# Patient Record
Sex: Female | Born: 1990 | Race: Black or African American | Hispanic: No | Marital: Single | Smoking: Never smoker
Health system: Southern US, Community
[De-identification: ages and names within clinical notes are randomized; demographics above are authoritative.]

## PROBLEM LIST (undated history)

## (undated) DIAGNOSIS — I82409 Acute embolism and thrombosis of unspecified deep veins of unspecified lower extremity: Secondary | ICD-10-CM

## (undated) DIAGNOSIS — E669 Obesity, unspecified: Secondary | ICD-10-CM

## (undated) HISTORY — PX: ANTERIOR CRUCIATE LIGAMENT REPAIR: SHX115

## (undated) HISTORY — DX: Obesity, unspecified: E66.9

## (undated) HISTORY — DX: Acute embolism and thrombosis of unspecified deep veins of unspecified lower extremity: I82.409

---
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
## 2019-11-04 LAB — OB RESULTS CONSOLE RPR: RPR: NONREACTIVE

## 2019-11-04 LAB — OB RESULTS CONSOLE GC/CHLAMYDIA
Chlamydia: NEGATIVE
Gonorrhea: NEGATIVE

## 2019-11-04 LAB — OB RESULTS CONSOLE HIV ANTIBODY (ROUTINE TESTING): HIV: NONREACTIVE

## 2019-11-04 LAB — OB RESULTS CONSOLE RUBELLA ANTIBODY, IGM: Rubella: IMMUNE

## 2020-04-26 ENCOUNTER — Other Ambulatory Visit: Payer: Self-pay | Admitting: Obstetrics and Gynecology

## 2020-04-26 ENCOUNTER — Ambulatory Visit: Payer: 59 | Admitting: *Deleted

## 2020-04-26 ENCOUNTER — Other Ambulatory Visit: Payer: Self-pay

## 2020-04-26 ENCOUNTER — Encounter: Payer: Self-pay | Admitting: *Deleted

## 2020-04-26 ENCOUNTER — Ambulatory Visit: Payer: 59 | Attending: Obstetrics and Gynecology

## 2020-04-26 DIAGNOSIS — Z363 Encounter for antenatal screening for malformations: Secondary | ICD-10-CM

## 2020-04-27 ENCOUNTER — Other Ambulatory Visit: Payer: Self-pay | Admitting: *Deleted

## 2020-04-27 DIAGNOSIS — O365930 Maternal care for other known or suspected poor fetal growth, third trimester, not applicable or unspecified: Secondary | ICD-10-CM

## 2020-05-03 ENCOUNTER — Encounter: Payer: Self-pay | Admitting: *Deleted

## 2020-05-03 ENCOUNTER — Ambulatory Visit: Payer: 59 | Attending: Obstetrics and Gynecology

## 2020-05-03 ENCOUNTER — Other Ambulatory Visit: Payer: Self-pay | Admitting: *Deleted

## 2020-05-03 ENCOUNTER — Other Ambulatory Visit: Payer: Self-pay

## 2020-05-03 ENCOUNTER — Ambulatory Visit: Payer: 59 | Admitting: *Deleted

## 2020-05-03 ENCOUNTER — Other Ambulatory Visit: Payer: Self-pay | Admitting: Obstetrics and Gynecology

## 2020-05-03 VITALS — BP 136/77 | HR 93

## 2020-05-03 DIAGNOSIS — O365930 Maternal care for other known or suspected poor fetal growth, third trimester, not applicable or unspecified: Secondary | ICD-10-CM

## 2020-05-10 ENCOUNTER — Ambulatory Visit: Payer: 59 | Attending: Obstetrics and Gynecology

## 2020-05-10 ENCOUNTER — Ambulatory Visit: Payer: 59 | Admitting: *Deleted

## 2020-05-10 ENCOUNTER — Encounter: Payer: Self-pay | Admitting: *Deleted

## 2020-05-10 ENCOUNTER — Other Ambulatory Visit: Payer: Self-pay

## 2020-05-10 DIAGNOSIS — O365930 Maternal care for other known or suspected poor fetal growth, third trimester, not applicable or unspecified: Secondary | ICD-10-CM | POA: Diagnosis not present

## 2020-05-11 ENCOUNTER — Other Ambulatory Visit: Payer: Self-pay | Admitting: *Deleted

## 2020-05-11 DIAGNOSIS — O365930 Maternal care for other known or suspected poor fetal growth, third trimester, not applicable or unspecified: Secondary | ICD-10-CM

## 2020-05-13 LAB — OB RESULTS CONSOLE GBS: GBS: NEGATIVE

## 2020-05-17 ENCOUNTER — Ambulatory Visit: Payer: 59 | Admitting: *Deleted

## 2020-05-17 ENCOUNTER — Encounter: Payer: Self-pay | Admitting: *Deleted

## 2020-05-17 ENCOUNTER — Other Ambulatory Visit: Payer: Self-pay

## 2020-05-17 ENCOUNTER — Other Ambulatory Visit (HOSPITAL_COMMUNITY): Payer: Self-pay

## 2020-05-17 ENCOUNTER — Ambulatory Visit: Payer: 59 | Attending: Obstetrics and Gynecology

## 2020-05-17 VITALS — BP 127/82 | HR 92

## 2020-05-17 DIAGNOSIS — O365930 Maternal care for other known or suspected poor fetal growth, third trimester, not applicable or unspecified: Secondary | ICD-10-CM | POA: Insufficient documentation

## 2020-05-17 DIAGNOSIS — Z86718 Personal history of other venous thrombosis and embolism: Secondary | ICD-10-CM

## 2020-05-17 DIAGNOSIS — O99213 Obesity complicating pregnancy, third trimester: Secondary | ICD-10-CM

## 2020-05-17 DIAGNOSIS — O10013 Pre-existing essential hypertension complicating pregnancy, third trimester: Secondary | ICD-10-CM

## 2020-05-17 DIAGNOSIS — E669 Obesity, unspecified: Secondary | ICD-10-CM

## 2020-05-17 DIAGNOSIS — O3113X0 Continuing pregnancy after spontaneous abortion of one fetus or more, third trimester, not applicable or unspecified: Secondary | ICD-10-CM

## 2020-05-17 DIAGNOSIS — Z3A36 36 weeks gestation of pregnancy: Secondary | ICD-10-CM

## 2020-05-17 MED ORDER — HEPARIN SODIUM (PORCINE) 5000 UNIT/ML IJ SOLN
5000.0000 [IU] | Freq: Two times a day (BID) | INTRAMUSCULAR | 0 refills | Status: DC
  Filled 2020-05-17: qty 42, 21d supply, fill #0

## 2020-05-17 MED ORDER — HEPARIN SODIUM (PORCINE) 5000 UNIT/0.5ML IJ SOSY
PREFILLED_SYRINGE | INTRAMUSCULAR | 0 refills | Status: DC
  Filled 2020-05-17: qty 14, 14d supply, fill #0

## 2020-05-17 MED ORDER — "INSULIN SYRINGE-NEEDLE U-100 30G X 5/16"" 1 ML MISC"
Freq: Two times a day (BID) | 0 refills | Status: DC
  Filled 2020-05-17: qty 40, 20d supply, fill #0

## 2020-05-20 ENCOUNTER — Other Ambulatory Visit: Payer: Self-pay | Admitting: Obstetrics and Gynecology

## 2020-05-20 DIAGNOSIS — O365930 Maternal care for other known or suspected poor fetal growth, third trimester, not applicable or unspecified: Secondary | ICD-10-CM

## 2020-05-24 ENCOUNTER — Other Ambulatory Visit: Payer: Self-pay | Admitting: Obstetrics

## 2020-05-24 ENCOUNTER — Other Ambulatory Visit: Payer: Self-pay

## 2020-05-24 ENCOUNTER — Ambulatory Visit: Payer: 59 | Attending: Obstetrics

## 2020-05-24 ENCOUNTER — Encounter: Payer: Self-pay | Admitting: *Deleted

## 2020-05-24 ENCOUNTER — Ambulatory Visit: Payer: 59 | Admitting: *Deleted

## 2020-05-24 VITALS — BP 125/77 | HR 95

## 2020-05-24 DIAGNOSIS — O365930 Maternal care for other known or suspected poor fetal growth, third trimester, not applicable or unspecified: Secondary | ICD-10-CM

## 2020-05-25 ENCOUNTER — Telehealth (HOSPITAL_COMMUNITY): Payer: Self-pay | Admitting: *Deleted

## 2020-05-25 ENCOUNTER — Encounter (HOSPITAL_COMMUNITY): Payer: Self-pay | Admitting: *Deleted

## 2020-05-25 ENCOUNTER — Other Ambulatory Visit: Payer: Self-pay | Admitting: *Deleted

## 2020-05-25 DIAGNOSIS — O365930 Maternal care for other known or suspected poor fetal growth, third trimester, not applicable or unspecified: Secondary | ICD-10-CM

## 2020-05-25 NOTE — Telephone Encounter (Signed)
Preadmission screen  

## 2020-06-02 ENCOUNTER — Ambulatory Visit (HOSPITAL_BASED_OUTPATIENT_CLINIC_OR_DEPARTMENT_OTHER): Payer: 59

## 2020-06-02 ENCOUNTER — Other Ambulatory Visit: Payer: Self-pay

## 2020-06-02 ENCOUNTER — Ambulatory Visit: Payer: 59 | Admitting: *Deleted

## 2020-06-02 ENCOUNTER — Encounter: Payer: Self-pay | Admitting: *Deleted

## 2020-06-02 ENCOUNTER — Other Ambulatory Visit (HOSPITAL_COMMUNITY)
Admission: RE | Admit: 2020-06-02 | Discharge: 2020-06-02 | Disposition: A | Discharge status: Home / Self Care | Payer: 59 | Source: Ambulatory Visit | Attending: Obstetrics and Gynecology | Admitting: Obstetrics and Gynecology

## 2020-06-02 VITALS — BP 135/81 | HR 90

## 2020-06-02 DIAGNOSIS — O10913 Unspecified pre-existing hypertension complicating pregnancy, third trimester: Secondary | ICD-10-CM | POA: Insufficient documentation

## 2020-06-02 DIAGNOSIS — O365930 Maternal care for other known or suspected poor fetal growth, third trimester, not applicable or unspecified: Secondary | ICD-10-CM

## 2020-06-02 DIAGNOSIS — Z3A38 38 weeks gestation of pregnancy: Secondary | ICD-10-CM | POA: Insufficient documentation

## 2020-06-02 DIAGNOSIS — Z20822 Contact with and (suspected) exposure to covid-19: Secondary | ICD-10-CM | POA: Insufficient documentation

## 2020-06-02 DIAGNOSIS — O99213 Obesity complicating pregnancy, third trimester: Secondary | ICD-10-CM | POA: Insufficient documentation

## 2020-06-02 LAB — SARS CORONAVIRUS 2 (TAT 6-24 HRS): SARS Coronavirus 2: NEGATIVE

## 2020-06-03 ENCOUNTER — Inpatient Hospital Stay (HOSPITAL_COMMUNITY): Admission: AD | Admit: 2020-06-03 | Payer: 59 | Source: Home / Self Care

## 2020-06-03 ENCOUNTER — Other Ambulatory Visit: Payer: Self-pay | Admitting: Obstetrics and Gynecology

## 2020-06-03 NOTE — H&P (Signed)
Amy Morgan is a 30 y.o. female G2P0010 at 38 weeks and 4 days presenting for induction of labor due to IUGR> pt has been followed by MFM ( induction recommended b/w 38 and 39 weeks )  Pregnancy also complicated by h/o DVT / Gestational hypertension/ morbid obesity. Prenatal care provided by Dr. Gerald Leitz with [facility].   U/S 4/4 EFW 4 lbs and 15 oz ( 5 %ile) normal dopplers . Posterior placenta / cephalic presentation.   Last does of heparin Thursday 4/21 in AM   . OB History    Gravida  2   Para      Term      Preterm      AB  1   Living        SAB      IAB  1   Ectopic      Multiple      Live Births             Past Medical History:  Diagnosis Date  . DVT (deep venous thrombosis) (HCC)   . Obesity    Past Surgical History:  Procedure Laterality Date  . ANTERIOR CRUCIATE LIGAMENT REPAIR     Family History: family history includes Diabetes in her paternal grandmother; Hypertension in her maternal grandmother. Social History:  reports that she has never smoked. She has never used smokeless tobacco. She reports previous alcohol use. She reports that she does not use drugs.     Maternal Diabetes: No Genetic Screening: Normal Maternal Ultrasounds/Referrals: Normal Fetal Ultrasounds or other Referrals:  Other:  Maternal Substance Abuse:  No Significant Maternal Medications:  None Significant Maternal Lab Results:  Group B Strep negative Other Comments:  None  Review of Systems  Constitutional: Negative.   HENT: Negative.   Eyes: Negative.   Respiratory: Negative.   Cardiovascular: Negative.   Gastrointestinal: Negative.   Endocrine: Negative.   Genitourinary: Negative.   Musculoskeletal: Negative.   Skin: Negative.   Allergic/Immunologic: Negative.   Neurological: Negative.   Hematological: Negative.   Psychiatric/Behavioral: Negative.    History   Last menstrual period 09/02/2019. Maternal Exam:  Introitus: Normal vulva.   Physical  Exam Vitals reviewed.  Constitutional:      Appearance: Normal appearance.  HENT:     Head: Normocephalic and atraumatic.     Nose: Nose normal.  Eyes:     Pupils: Pupils are equal, round, and reactive to light.  Cardiovascular:     Rate and Rhythm: Normal rate and regular rhythm.     Pulses: Normal pulses.     Heart sounds: Normal heart sounds.  Pulmonary:     Effort: Pulmonary effort is normal.     Breath sounds: Normal breath sounds.  Abdominal:     Tenderness: There is no abdominal tenderness.  Genitourinary:    General: Normal vulva.  Musculoskeletal:        General: Normal range of motion.     Cervical back: Normal range of motion and neck supple.  Skin:    General: Skin is warm and dry.  Neurological:     General: No focal deficit present.     Mental Status: She is alert and oriented to person, place, and time.  Psychiatric:        Mood and Affect: Mood normal.     Prenatal labs: ABO, Rh:  O positive  Antibody:  Negative  Rubella:   Immune  RPR:   Nonreactive  HBsAg:  Negative   HIV:  Negative  GBS:   negative on 05/13/2020 Assessment/Plan: 38 weeks and 4 days with IUGR/ H/O DVT/ gestational hypertension / Morbid obesity  - Admit to Labor and Delivery for induction  - Cytotec for cervical ripening - gbs negative  -DVT prophylaxis - d/c heparin . Plan SCD's in peripartum and restart lovenox postpartum  - PIH labs on admission  - Anticipate SVD    Gerald Leitz 06/03/2020, 6:16 PM

## 2020-06-03 NOTE — H&P (Deleted)
  The note originally documented on this encounter has been moved the the encounter in which it belongs.  

## 2020-06-04 ENCOUNTER — Encounter (HOSPITAL_COMMUNITY): Payer: Self-pay | Admitting: Obstetrics and Gynecology

## 2020-06-04 ENCOUNTER — Inpatient Hospital Stay (HOSPITAL_COMMUNITY): Payer: 59 | Admitting: Anesthesiology

## 2020-06-04 ENCOUNTER — Encounter (HOSPITAL_COMMUNITY)
Admission: AD | Disposition: A | Discharge status: Home / Self Care | Payer: Self-pay | Source: Home / Self Care | Attending: Obstetrics and Gynecology

## 2020-06-04 ENCOUNTER — Other Ambulatory Visit: Payer: Self-pay

## 2020-06-04 ENCOUNTER — Inpatient Hospital Stay (HOSPITAL_COMMUNITY): Payer: 59

## 2020-06-04 ENCOUNTER — Inpatient Hospital Stay (HOSPITAL_COMMUNITY)
Admission: AD | Admit: 2020-06-04 | Discharge: 2020-06-07 | DRG: 788 | Disposition: A | Discharge status: Home / Self Care | Payer: 59 | Attending: Obstetrics and Gynecology | Admitting: Obstetrics and Gynecology

## 2020-06-04 DIAGNOSIS — O99214 Obesity complicating childbirth: Secondary | ICD-10-CM | POA: Diagnosis present

## 2020-06-04 DIAGNOSIS — K219 Gastro-esophageal reflux disease without esophagitis: Secondary | ICD-10-CM | POA: Diagnosis present

## 2020-06-04 DIAGNOSIS — O134 Gestational [pregnancy-induced] hypertension without significant proteinuria, complicating childbirth: Secondary | ICD-10-CM | POA: Diagnosis present

## 2020-06-04 DIAGNOSIS — O365930 Maternal care for other known or suspected poor fetal growth, third trimester, not applicable or unspecified: Principal | ICD-10-CM | POA: Diagnosis present

## 2020-06-04 DIAGNOSIS — O1002 Pre-existing essential hypertension complicating childbirth: Secondary | ICD-10-CM | POA: Diagnosis present

## 2020-06-04 DIAGNOSIS — O365931 Maternal care for other known or suspected poor fetal growth, third trimester, fetus 1: Secondary | ICD-10-CM | POA: Diagnosis present

## 2020-06-04 DIAGNOSIS — Z86718 Personal history of other venous thrombosis and embolism: Secondary | ICD-10-CM

## 2020-06-04 DIAGNOSIS — O9962 Diseases of the digestive system complicating childbirth: Secondary | ICD-10-CM | POA: Diagnosis present

## 2020-06-04 DIAGNOSIS — Z20822 Contact with and (suspected) exposure to covid-19: Secondary | ICD-10-CM | POA: Diagnosis present

## 2020-06-04 DIAGNOSIS — Z3A38 38 weeks gestation of pregnancy: Secondary | ICD-10-CM | POA: Diagnosis not present

## 2020-06-04 DIAGNOSIS — Z98891 History of uterine scar from previous surgery: Secondary | ICD-10-CM

## 2020-06-04 LAB — COMPREHENSIVE METABOLIC PANEL WITH GFR
ALT: 15 U/L (ref 0–44)
AST: 17 U/L (ref 15–41)
Albumin: 2.7 g/dL — ABNORMAL LOW (ref 3.5–5.0)
Alkaline Phosphatase: 58 U/L (ref 38–126)
Anion gap: 9 (ref 5–15)
BUN: 7 mg/dL (ref 6–20)
CO2: 23 mmol/L (ref 22–32)
Calcium: 9.4 mg/dL (ref 8.9–10.3)
Chloride: 104 mmol/L (ref 98–111)
Creatinine, Ser: 0.66 mg/dL (ref 0.44–1.00)
GFR, Estimated: >60 mL/min (ref >60)
Glucose, Bld: 124 mg/dL — ABNORMAL HIGH (ref 70–99)
Potassium: 3.6 mmol/L (ref 3.5–5.1)
Sodium: 136 mmol/L (ref 135–145)
Total Bilirubin: 0.3 mg/dL (ref 0.3–1.2)
Total Protein: 6 g/dL — ABNORMAL LOW (ref 6.5–8.1)

## 2020-06-04 LAB — CBC
HCT: 31 % — ABNORMAL LOW (ref 36.0–46.0)
Hemoglobin: 10.5 g/dL — ABNORMAL LOW (ref 12.0–15.0)
MCH: 29.7 pg (ref 26.0–34.0)
MCHC: 33.9 g/dL (ref 30.0–36.0)
MCV: 87.8 fL (ref 80.0–100.0)
Platelets: 226 10*3/uL (ref 150–400)
RBC: 3.53 MIL/uL — ABNORMAL LOW (ref 3.87–5.11)
RDW: 13.1 % (ref 11.5–15.5)
WBC: 10.7 10*3/uL — ABNORMAL HIGH (ref 4.0–10.5)
nRBC: 0 % (ref 0.0–0.2)

## 2020-06-04 LAB — PROTEIN / CREATININE RATIO, URINE
Creatinine, Urine: 124.21 mg/dL
Protein Creatinine Ratio: 0.08 mg/mg{creat} (ref 0.00–0.15)
Total Protein, Urine: 10 mg/dL

## 2020-06-04 LAB — RPR: RPR Ser Ql: NONREACTIVE

## 2020-06-04 LAB — TYPE AND SCREEN
ABO/RH(D): O POS
Antibody Screen: NEGATIVE

## 2020-06-04 SURGERY — Surgical Case
Anesthesia: Spinal

## 2020-06-04 MED ORDER — NALBUPHINE HCL 10 MG/ML IJ SOLN: 5.0000 mg | Freq: Once | INTRAMUSCULAR | Status: DC | PRN

## 2020-06-04 MED ORDER — SOD CITRATE-CITRIC ACID 500-334 MG/5ML PO SOLN
30.0000 mL | ORAL | Status: DC | PRN
  Filled 2020-06-04: qty 15

## 2020-06-04 MED ORDER — METOCLOPRAMIDE HCL 5 MG/ML IJ SOLN
INTRAMUSCULAR | Status: DC | PRN
  Administered 2020-06-04: 10 mg via INTRAVENOUS

## 2020-06-04 MED ORDER — ACETAMINOPHEN 325 MG PO TABS: 650.0000 mg | ORAL_TABLET | ORAL | Status: DC | PRN

## 2020-06-04 MED ORDER — LIDOCAINE HCL (PF) 1 % IJ SOLN: 30.0000 mL | INTRAMUSCULAR | Status: DC | PRN

## 2020-06-04 MED ORDER — COCONUT OIL OIL: 1.0000 "application " | TOPICAL_OIL | Status: DC | PRN

## 2020-06-04 MED ORDER — NALOXONE HCL 4 MG/10ML IJ SOLN
1.0000 ug/kg/h | INTRAVENOUS | Status: DC | PRN
  Filled 2020-06-04: qty 5

## 2020-06-04 MED ORDER — ONDANSETRON HCL 4 MG/2ML IJ SOLN: 4.0000 mg | Freq: Four times a day (QID) | INTRAMUSCULAR | Status: DC | PRN

## 2020-06-04 MED ORDER — KETOROLAC TROMETHAMINE 30 MG/ML IJ SOLN
30.0000 mg | Freq: Four times a day (QID) | INTRAMUSCULAR | Status: AC | PRN
  Administered 2020-06-04: 30 mg via INTRAMUSCULAR

## 2020-06-04 MED ORDER — SIMETHICONE 80 MG PO CHEW
80.0000 mg | CHEWABLE_TABLET | ORAL | Status: DC | PRN
  Administered 2020-06-05: 80 mg via ORAL

## 2020-06-04 MED ORDER — MORPHINE SULFATE (PF) 0.5 MG/ML IJ SOLN
INTRAMUSCULAR | Status: AC
  Filled 2020-06-04: qty 10

## 2020-06-04 MED ORDER — OXYTOCIN-SODIUM CHLORIDE 30-0.9 UT/500ML-% IV SOLN
1.0000 m[IU]/min | INTRAVENOUS | Status: DC
  Administered 2020-06-04: 1 m[IU]/min via INTRAVENOUS

## 2020-06-04 MED ORDER — FENTANYL CITRATE (PF) 100 MCG/2ML IJ SOLN: 50.0000 ug | INTRAMUSCULAR | Status: DC | PRN

## 2020-06-04 MED ORDER — PHENYLEPHRINE HCL-NACL 20-0.9 MG/250ML-% IV SOLN
INTRAVENOUS | Status: AC
  Filled 2020-06-04: qty 250

## 2020-06-04 MED ORDER — MEDROXYPROGESTERONE ACETATE 150 MG/ML IM SUSP: 150.0000 mg | INTRAMUSCULAR | Status: DC | PRN

## 2020-06-04 MED ORDER — OXYTOCIN-SODIUM CHLORIDE 30-0.9 UT/500ML-% IV SOLN: 2.5000 [IU]/h | INTRAVENOUS | Status: AC

## 2020-06-04 MED ORDER — CEFAZOLIN SODIUM-DEXTROSE 2-3 GM-%(50ML) IV SOLR
INTRAVENOUS | Status: DC | PRN
  Administered 2020-06-04: 3 g via INTRAVENOUS

## 2020-06-04 MED ORDER — OXYTOCIN-SODIUM CHLORIDE 30-0.9 UT/500ML-% IV SOLN
INTRAVENOUS | Status: DC | PRN
  Administered 2020-06-04: 300 mL via INTRAVENOUS

## 2020-06-04 MED ORDER — MENTHOL 3 MG MT LOZG: 1.0000 | LOZENGE | OROMUCOSAL | Status: DC | PRN

## 2020-06-04 MED ORDER — WITCH HAZEL-GLYCERIN EX PADS: 1.0000 "application " | MEDICATED_PAD | CUTANEOUS | Status: DC | PRN

## 2020-06-04 MED ORDER — ONDANSETRON HCL 4 MG/2ML IJ SOLN
INTRAMUSCULAR | Status: AC
  Filled 2020-06-04: qty 2

## 2020-06-04 MED ORDER — KETOROLAC TROMETHAMINE 30 MG/ML IJ SOLN
INTRAMUSCULAR | Status: AC
  Filled 2020-06-04: qty 1

## 2020-06-04 MED ORDER — OXYTOCIN 10 UNIT/ML IJ SOLN
INTRAMUSCULAR | Status: AC
  Filled 2020-06-04: qty 1

## 2020-06-04 MED ORDER — CEFAZOLIN IN SODIUM CHLORIDE 3-0.9 GM/100ML-% IV SOLN
INTRAVENOUS | Status: AC
  Filled 2020-06-04: qty 100

## 2020-06-04 MED ORDER — CEFAZOLIN IN SODIUM CHLORIDE 3-0.9 GM/100ML-% IV SOLN
3.0000 g | INTRAVENOUS | Status: DC
  Filled 2020-06-04 (×2): qty 100

## 2020-06-04 MED ORDER — NALOXONE HCL 0.4 MG/ML IJ SOLN: 0.4000 mg | INTRAMUSCULAR | Status: DC | PRN

## 2020-06-04 MED ORDER — DIPHENHYDRAMINE HCL 25 MG PO CAPS: 25.0000 mg | ORAL_CAPSULE | ORAL | Status: DC | PRN

## 2020-06-04 MED ORDER — DIBUCAINE (PERIANAL) 1 % EX OINT: 1.0000 "application " | TOPICAL_OINTMENT | CUTANEOUS | Status: DC | PRN

## 2020-06-04 MED ORDER — ONDANSETRON HCL 4 MG/2ML IJ SOLN
INTRAMUSCULAR | Status: DC | PRN
  Administered 2020-06-04: 4 mg via INTRAVENOUS

## 2020-06-04 MED ORDER — OXYTOCIN-SODIUM CHLORIDE 30-0.9 UT/500ML-% IV SOLN
INTRAVENOUS | Status: AC
  Filled 2020-06-04: qty 500

## 2020-06-04 MED ORDER — ACETAMINOPHEN 500 MG PO TABS
1000.0000 mg | ORAL_TABLET | Freq: Four times a day (QID) | ORAL | Status: DC
  Administered 2020-06-04 – 2020-06-07 (×12): 1000 mg via ORAL
  Filled 2020-06-04 (×12): qty 2

## 2020-06-04 MED ORDER — ZOLPIDEM TARTRATE 5 MG PO TABS: 5.0000 mg | ORAL_TABLET | Freq: Every evening | ORAL | Status: DC | PRN

## 2020-06-04 MED ORDER — SCOPOLAMINE 1 MG/3DAYS TD PT72
MEDICATED_PATCH | TRANSDERMAL | Status: AC
  Filled 2020-06-04: qty 1

## 2020-06-04 MED ORDER — IBUPROFEN 600 MG PO TABS
600.0000 mg | ORAL_TABLET | Freq: Four times a day (QID) | ORAL | Status: AC
  Administered 2020-06-04 – 2020-06-07 (×11): 600 mg via ORAL
  Filled 2020-06-04 (×11): qty 1

## 2020-06-04 MED ORDER — DEXAMETHASONE SODIUM PHOSPHATE 10 MG/ML IJ SOLN
INTRAMUSCULAR | Status: AC
  Filled 2020-06-04: qty 1

## 2020-06-04 MED ORDER — DIPHENHYDRAMINE HCL 50 MG/ML IJ SOLN: 12.5000 mg | INTRAMUSCULAR | Status: DC | PRN

## 2020-06-04 MED ORDER — PHENYLEPHRINE HCL-NACL 20-0.9 MG/250ML-% IV SOLN
INTRAVENOUS | Status: DC | PRN
  Administered 2020-06-04: 60 ug/min via INTRAVENOUS

## 2020-06-04 MED ORDER — TERBUTALINE SULFATE 1 MG/ML IJ SOLN: 0.2500 mg | Freq: Once | INTRAMUSCULAR | Status: DC | PRN

## 2020-06-04 MED ORDER — SCOPOLAMINE 1 MG/3DAYS TD PT72
MEDICATED_PATCH | TRANSDERMAL | Status: DC | PRN
  Administered 2020-06-04: 1 via TRANSDERMAL

## 2020-06-04 MED ORDER — BUPIVACAINE IN DEXTROSE 0.75-8.25 % IT SOLN
INTRATHECAL | Status: DC | PRN
  Administered 2020-06-04: 1.7 mL via INTRATHECAL

## 2020-06-04 MED ORDER — HYDROMORPHONE HCL 1 MG/ML IJ SOLN: 0.2000 mg | INTRAMUSCULAR | Status: DC | PRN

## 2020-06-04 MED ORDER — KETOROLAC TROMETHAMINE 30 MG/ML IJ SOLN: 30.0000 mg | Freq: Four times a day (QID) | INTRAMUSCULAR | Status: AC | PRN

## 2020-06-04 MED ORDER — PHENYLEPHRINE 40 MCG/ML (10ML) SYRINGE FOR IV PUSH (FOR BLOOD PRESSURE SUPPORT)
PREFILLED_SYRINGE | INTRAVENOUS | Status: AC
  Filled 2020-06-04: qty 10

## 2020-06-04 MED ORDER — NALBUPHINE HCL 10 MG/ML IJ SOLN: 5.0000 mg | INTRAMUSCULAR | Status: DC | PRN

## 2020-06-04 MED ORDER — DIPHENHYDRAMINE HCL 25 MG PO CAPS: 25.0000 mg | ORAL_CAPSULE | Freq: Four times a day (QID) | ORAL | Status: DC | PRN

## 2020-06-04 MED ORDER — TRANEXAMIC ACID-NACL 1000-0.7 MG/100ML-% IV SOLN
INTRAVENOUS | Status: AC
  Filled 2020-06-04: qty 100

## 2020-06-04 MED ORDER — DEXAMETHASONE SODIUM PHOSPHATE 4 MG/ML IJ SOLN
INTRAMUSCULAR | Status: DC | PRN
  Administered 2020-06-04: 8 mg via INTRAVENOUS

## 2020-06-04 MED ORDER — MISOPROSTOL 25 MCG QUARTER TABLET
25.0000 ug | ORAL_TABLET | ORAL | Status: DC | PRN
  Administered 2020-06-04: 25 ug via VAGINAL
  Filled 2020-06-04: qty 1

## 2020-06-04 MED ORDER — HYDROXYZINE HCL 50 MG PO TABS: 50.0000 mg | ORAL_TABLET | Freq: Four times a day (QID) | ORAL | Status: DC | PRN

## 2020-06-04 MED ORDER — LACTATED RINGERS IV SOLN: INTRAVENOUS | Status: DC

## 2020-06-04 MED ORDER — ENOXAPARIN SODIUM 80 MG/0.8ML ~~LOC~~ SOLN
70.0000 mg | SUBCUTANEOUS | Status: DC
  Administered 2020-06-05 – 2020-06-07 (×3): 70 mg via SUBCUTANEOUS
  Filled 2020-06-04 (×3): qty 0.8

## 2020-06-04 MED ORDER — PRENATAL MULTIVITAMIN CH
1.0000 | ORAL_TABLET | Freq: Every day | ORAL | Status: DC
  Administered 2020-06-05 – 2020-06-07 (×3): 1 via ORAL
  Filled 2020-06-04 (×3): qty 1

## 2020-06-04 MED ORDER — OXYTOCIN BOLUS FROM INFUSION: 333.0000 mL | Freq: Once | INTRAVENOUS | Status: DC

## 2020-06-04 MED ORDER — ONDANSETRON HCL 4 MG/2ML IJ SOLN: 4.0000 mg | Freq: Three times a day (TID) | INTRAMUSCULAR | Status: DC | PRN

## 2020-06-04 MED ORDER — SODIUM CHLORIDE 0.9% FLUSH: 3.0000 mL | INTRAVENOUS | Status: DC | PRN

## 2020-06-04 MED ORDER — FENTANYL CITRATE (PF) 100 MCG/2ML IJ SOLN
INTRAMUSCULAR | Status: AC
  Filled 2020-06-04: qty 2

## 2020-06-04 MED ORDER — DEXAMETHASONE SODIUM PHOSPHATE 4 MG/ML IJ SOLN
INTRAMUSCULAR | Status: AC
  Filled 2020-06-04: qty 2

## 2020-06-04 MED ORDER — FERROUS SULFATE 325 (65 FE) MG PO TABS
325.0000 mg | ORAL_TABLET | Freq: Two times a day (BID) | ORAL | Status: DC
  Administered 2020-06-04 – 2020-06-07 (×6): 325 mg via ORAL
  Filled 2020-06-04 (×6): qty 1

## 2020-06-04 MED ORDER — METOCLOPRAMIDE HCL 5 MG/ML IJ SOLN
INTRAMUSCULAR | Status: AC
  Filled 2020-06-04: qty 2

## 2020-06-04 MED ORDER — LACTATED RINGERS IV SOLN: INTRAVENOUS | Status: DC | PRN

## 2020-06-04 MED ORDER — OXYTOCIN-SODIUM CHLORIDE 30-0.9 UT/500ML-% IV SOLN
2.5000 [IU]/h | INTRAVENOUS | Status: DC
  Filled 2020-06-04: qty 500

## 2020-06-04 MED ORDER — SOD CITRATE-CITRIC ACID 500-334 MG/5ML PO SOLN: 30.0000 mL | ORAL | Status: DC

## 2020-06-04 MED ORDER — FENTANYL CITRATE (PF) 100 MCG/2ML IJ SOLN
INTRAMUSCULAR | Status: DC | PRN
  Administered 2020-06-04: 15 ug via INTRATHECAL

## 2020-06-04 MED ORDER — LACTATED RINGERS IV SOLN
500.0000 mL | INTRAVENOUS | Status: DC | PRN
  Administered 2020-06-04 (×2): 500 mL via INTRAVENOUS

## 2020-06-04 MED ORDER — SENNOSIDES-DOCUSATE SODIUM 8.6-50 MG PO TABS
2.0000 | ORAL_TABLET | Freq: Every day | ORAL | Status: DC
  Administered 2020-06-05 – 2020-06-07 (×3): 2 via ORAL
  Filled 2020-06-04 (×3): qty 2

## 2020-06-04 MED ORDER — MORPHINE SULFATE (PF) 0.5 MG/ML IJ SOLN
INTRAMUSCULAR | Status: DC | PRN
  Administered 2020-06-04: .15 mg via INTRATHECAL

## 2020-06-04 MED ORDER — SIMETHICONE 80 MG PO CHEW
80.0000 mg | CHEWABLE_TABLET | Freq: Three times a day (TID) | ORAL | Status: DC
  Administered 2020-06-04 – 2020-06-07 (×7): 80 mg via ORAL
  Filled 2020-06-04 (×8): qty 1

## 2020-06-04 MED ORDER — OXYCODONE HCL 5 MG PO TABS
5.0000 mg | ORAL_TABLET | ORAL | Status: DC | PRN
Start: 2020-06-04 — End: 2020-06-07

## 2020-06-04 SURGICAL SUPPLY — 38 items
BARRIER ADHS 3X4 INTERCEED (GAUZE/BANDAGES/DRESSINGS) ×1 IMPLANT
BENZOIN TINCTURE PRP APPL 2/3 (GAUZE/BANDAGES/DRESSINGS) ×2 IMPLANT
CHLORAPREP W/TINT 26ML (MISCELLANEOUS) ×2 IMPLANT
CLAMP CORD UMBIL (MISCELLANEOUS) IMPLANT
CLOTH BEACON ORANGE TIMEOUT ST (SAFETY) ×2 IMPLANT
DRSG OPSITE POSTOP 4X10 (GAUZE/BANDAGES/DRESSINGS) ×2 IMPLANT
DRSG PAD ABDOMINAL 8X10 ST (GAUZE/BANDAGES/DRESSINGS) ×1 IMPLANT
ELECT REM PT RETURN 9FT ADLT (ELECTROSURGICAL) ×2
ELECTRODE REM PT RTRN 9FT ADLT (ELECTROSURGICAL) ×1 IMPLANT
EXTRACTOR VACUUM KIWI (MISCELLANEOUS) IMPLANT
GAUZE SPONGE 4X4 12PLY STRL LF (GAUZE/BANDAGES/DRESSINGS) ×2 IMPLANT
GLOVE BIOGEL M 7.0 STRL (GLOVE) ×4 IMPLANT
GLOVE BIOGEL PI IND STRL 7.0 (GLOVE) ×3 IMPLANT
GLOVE BIOGEL PI INDICATOR 7.0 (GLOVE) ×3
GOWN STRL REUS W/TWL LRG LVL3 (GOWN DISPOSABLE) ×6 IMPLANT
KIT ABG SYR 3ML LUER SLIP (SYRINGE) IMPLANT
NDL HYPO 25X5/8 SAFETYGLIDE (NEEDLE) IMPLANT
NEEDLE HYPO 25X5/8 SAFETYGLIDE (NEEDLE) IMPLANT
NS IRRIG 1000ML POUR BTL (IV SOLUTION) ×2 IMPLANT
PACK C SECTION WH (CUSTOM PROCEDURE TRAY) ×2 IMPLANT
PAD ABD 7.5X8 STRL (GAUZE/BANDAGES/DRESSINGS) ×1 IMPLANT
PAD OB MATERNITY 4.3X12.25 (PERSONAL CARE ITEMS) ×2 IMPLANT
PENCIL SMOKE EVAC W/HOLSTER (ELECTROSURGICAL) ×2 IMPLANT
RTRCTR C-SECT PINK 25CM LRG (MISCELLANEOUS) IMPLANT
STRIP CLOSURE SKIN 1/2X4 (GAUZE/BANDAGES/DRESSINGS) ×2 IMPLANT
STRIP SURGICAL 1/2 X 6 IN (GAUZE/BANDAGES/DRESSINGS) ×1 IMPLANT
SUT MNCRL 0 VIOLET CTX 36 (SUTURE) ×2 IMPLANT
SUT MONOCRYL 0 CTX 36 (SUTURE) ×2
SUT PDS AB 0 CT1 27 (SUTURE) ×4 IMPLANT
SUT PLAIN 0 NONE (SUTURE) IMPLANT
SUT VIC AB 2-0 CT1 27 (SUTURE) ×2
SUT VIC AB 2-0 CT1 TAPERPNT 27 (SUTURE) ×1 IMPLANT
SUT VIC AB 3-0 SH 27 (SUTURE)
SUT VIC AB 3-0 SH 27X BRD (SUTURE) IMPLANT
SUT VIC AB 4-0 KS 27 (SUTURE) ×2 IMPLANT
TOWEL OR 17X24 6PK STRL BLUE (TOWEL DISPOSABLE) ×2 IMPLANT
TRAY FOLEY W/BAG SLVR 14FR LF (SET/KITS/TRAYS/PACK) ×2 IMPLANT
WATER STERILE IRR 1000ML POUR (IV SOLUTION) ×2 IMPLANT

## 2020-06-04 NOTE — Anesthesia Procedure Notes (Signed)
Spinal  Patient location during procedure: OR Start time: 06/04/2020 9:04 AM End time: 06/04/2020 9:09 AM Staffing Performed: anesthesiologist  Anesthesiologist: Mal Amabile, MD Preanesthetic Checklist Completed: patient identified, IV checked, site marked, risks and benefits discussed, surgical consent, monitors and equipment checked, pre-op evaluation and timeout performed Spinal Block Patient position: sitting Prep: DuraPrep and site prepped and draped Patient monitoring: cardiac monitor, continuous pulse ox, blood pressure and heart rate Approach: midline Location: L3-4 Injection technique: catheter Needle Needle type: Tuohy and Spinocan  Needle gauge: 24 G Needle length: 12.7 cm Needle insertion depth: 10 cm Catheter type: closed end flexible Catheter size: 19 g Assessment Sensory level: T4 Events: CSF return Additional Notes Attempt x 2 with Pencan. Epidural then performed using 17Ga Touhy needle LOR with air at 10cm. 25Ga spinocan through the epidural needle. CSF clear with no heme or paresthesias. Anesthetic injected and both needles withdrawn. Patient placed supine with LUD. Patient tolerated procedure well. Adequate sensory level.

## 2020-06-04 NOTE — Progress Notes (Signed)
    Subjective: Pt is comfortable no contractins +FM  No lof... the AM pt had recurrent late decelertions resolved with fluid bolus and repositioning.   Objective: BP 118/64   Pulse 92   Temp 98.1 F (36.7 C) (Oral)   Ht 5\' 8"  (1.727 m)   Wt (!) 141.5 kg   LMP 09/02/2019 (Exact Date)   BMI 47.44 kg/m  No intake/output data recorded. No intake/output data recorded.  FHT:  FHR: 145 bpm, variability: moderate,  accelerations:  Present,  decelerations:  Absent UC:   none SVE:   Dilation: Fingertip Effacement (%): 20 Station: -3 Exam by:: [email or web address] MD  Labs: Lab Results  Component Value Date   WBC 10.7 (H) 06/04/2020   HGB 10.5 (L) 06/04/2020   HCT 31.0 (L) 06/04/2020   MCV 87.8 06/04/2020   PLT 226 06/04/2020    Assessment / Plan: induction of labor due to IUGR  Labor: plan foley bulb and pit 1x1 and hold at 6  Preeclampsia:  labs stable Fetal Wellbeing:  Category I Pain Control:  Labor support without medications I/D:  n/a Anticipated MOD:  questionable.. pt not currently having contractions. d/w pt recommendation of cesarean section if recurrent late decelerations occur once pitocin is started.   06/06/2020 06/04/2020, 8:46 AM

## 2020-06-04 NOTE — Progress Notes (Signed)
Amy Morgan is a 30 y.o. G2P0010 at [redacted]w[redacted]d by admitted for IOL for IUGR.  Subjective:  RN called me at 0533 to review tracing. FHR 155, moderate variability with recurrent lates noted. I called Dr Normand Sloop to review tracing. POC discussed. I called RN. Encouraged intrauterine resuscitation with position changes and IVB. No further cytotec at present.   Objective: Vitals:   06/04/20 0034 06/04/20 0122 06/04/20 0407 06/04/20 0500  BP:  117/90 118/78 95/74  Pulse:  92 87 89  Temp:  98.1 F (36.7 C)    TempSrc:  Oral    Weight: (!) 141.5 kg     Height: 5\' 8"  (1.727 m)       No intake/output data recorded.    FHT:  FHR: 155 bpm, variability: moderate,  accelerations:  Abscent,  decelerations:  Present late UC:   regular, every 2-4 minutes SVE:   Dilation: 2 Effacement (%): 20 Station: -3 Exam by:: [email or web address], RN  Labs:   Recent Labs    06/04/20 0043  WBC 10.7*  HGB 10.5*  HCT 31.0*  PLT 226    Assessment / Plan: 30 y.o. AGP@ [redacted]w[redacted]d IOL for IUGR Labor: cytotec x 1 dose Preeclampsia:  no signs or symptoms of toxicity Fetal Wellbeing:  Category II Pain Control:  Epidural  Prn I/D:  Negative Anticipated MOD:  Possible cesarean d/t fetal intolerance  Dr [redacted]w[redacted]d notified and review tracing. POC discussed. Plan to do intrauterine resuscitation with position change and IVB.     Normand Sloop MSN, CNM 06/04/2020, 5:53 AM

## 2020-06-04 NOTE — Anesthesia Preprocedure Evaluation (Addendum)
Anesthesia Evaluation  Patient identified by MRN, date of birth, ID band Patient awake    Reviewed: Allergy & Precautions, NPO status , Patient's Chart, lab work & pertinent test results  Airway Mallampati: III  TM Distance: >3 FB Neck ROM: Full    Dental no notable dental hx. (+) Teeth Intact, Dental Advisory Given   Pulmonary neg pulmonary ROS,  Snores   Pulmonary exam normal breath sounds clear to auscultation       Cardiovascular + DVT  Normal cardiovascular exam Rhythm:Regular Rate:Normal  DVT during pregnancy- was on Lovenox, transitioned to heparin   Neuro/Psych negative neurological ROS  negative psych ROS   GI/Hepatic Neg liver ROS, GERD  Medicated and Controlled,  Endo/Other  Morbid obesity  Renal/GU   negative genitourinary   Musculoskeletal   Abdominal (+) + obese,   Peds  Hematology  (+) anemia , Heparin therapy- last dose yesterday am 5,000u unfractionated   Anesthesia Other Findings   Reproductive/Obstetrics (+) Pregnancy 38 4/7 weeks IUGR Fetal intolerance to labor                           Anesthesia Physical Anesthesia Plan  ASA: III and emergent  Anesthesia Plan: Spinal   Post-op Pain Management:    Induction:   PONV Risk Score and Plan: 4 or greater and Treatment may vary due to age or medical condition, Scopolamine patch - Pre-op and Ondansetron  Airway Management Planned: Natural Airway  Additional Equipment:   Intra-op Plan:   Post-operative Plan:   Informed Consent: I have reviewed the patients History and Physical, chart, labs and discussed the procedure including the risks, benefits and alternatives for the proposed anesthesia with the patient or authorized representative who has indicated his/her understanding and acceptance.     Dental advisory given  Plan Discussed with: CRNA and Anesthesiologist  Anesthesia Plan Comments:          Anesthesia Quick Evaluation

## 2020-06-04 NOTE — Lactation Note (Signed)
This note was copied from a baby's chart. Lactation Consultation Note  Patient Name: Amy Morgan [id number] Date: 06/04/2020 Reason for consult: Initial assessment;Early term 37-38.6wks;Primapara;1st time breastfeeding;Infant < 6lbs Age:28 hours   P1 mother whose infant is now 49 hours old.  This is an ETI at 38+4 weeks weighing < 6 lbs.    RN called for assistance.  Baby's initial blood glucose level was 21.    Baby STS at mother's side when I arrived.  Taught hand expression but mother was only able to express one drop of colostrum which I finger fed to baby.  Due to very low blood sugar I suggested we dismiss breast feeding at this time.  RN in room to administer glucose gel.  RN discussed supplementation options and mother willing to use donor breast milk.  Consent signed and RN, Deven, obtained donor breast milk and labels.  Baby initially sucked fairly well on my gloved finger but became more uninterested in sucking on the artificial nipple.  With cheek and jaw support he was able to consume 7 mls of donor milk.  He required much encouragement to suck and became very sleepy.  Placed him STS on father's chest with two hats and a blanket cover.  Suggested mother begin pumping with the DEBP to help with breast stimulation and to eventually obtain colostrum drops for feeding.  Mother happy to begin pumping.  Reviewed pump parts, assembly, disassembly and cleaning.  Observed mother pumping and she was unable to obtain any colostrum at this time.  Discussed finger feeding drops when available.  Mother will pump every three hours and incorporate breast massage and hand expression before/after pumping.  RN entered a follow up glucose level to be drawn at 1430.  Parents aware of plan. MD in room for baby's assessment during my visit.  Mother appreciative of help provided.     Maternal Data Has patient been taught Hand Expression?: Yes Does the patient have breastfeeding experience prior to this  delivery?: No  Feeding Mother's Current Feeding Choice: Breast Milk and Donor Milk Nipple Type: Slow - flow  LATCH Score                    Lactation Tools Discussed/Used Tools: Pump Breast pump type: Manual;Double-Electric Breast Pump Pump Education: Setup, frequency, and cleaning;Milk Storage Reason for Pumping: Nipple eversion and breast stimulation for supplementation Pumping frequency: Every three hours Pumped volume: 0 mL  Interventions Interventions: Education  Discharge Pump: DEBP;Manual;Personal  Consult Status Consult Status: Follow-up Date: 06/05/20 Follow-up type: In-patient    Amy Morgan R Amy Morgan 06/04/2020, 1:29 PM

## 2020-06-04 NOTE — Transfer of Care (Signed)
Immediate Anesthesia Transfer of Care Note  Patient: Amy Morgan  Procedure(s) Performed: CESAREAN SECTION (N/A )  Patient Location: PACU  Anesthesia Type:Spinal  Level of Consciousness: awake, alert  and oriented  Airway & Oxygen Therapy: Patient Spontanous Breathing  Post-op Assessment: Report given to RN and Post -op Vital signs reviewed and stable  Post vital signs: Reviewed and stable  Last Vitals:  Vitals Value Taken Time  BP    Temp    Pulse 76 06/04/20 1034  Resp 17 06/04/20 1034  SpO2 100 % 06/04/20 1034  Vitals shown include unvalidated device data.  Last Pain:  Vitals:   06/04/20 0122  TempSrc: Oral         Complications: No complications documented.

## 2020-06-04 NOTE — Op Note (Signed)
Cesarean Section Procedure Note  Indications: non-reassuring fetal status  Pre-operative Diagnosis: 38 week 4 day pregnancy.  Post-operative Diagnosis: same  Surgeon: Gerald Leitz M.D.  Assistants: Dr. Steva Ready assisted due to complexity of the anatomy   Anesthesia: Spinal anesthesia  ASA Class: 2   Procedure Details   The patient was seen in the Holding Room. The risks, benefits, complications, treatment options, and expected outcomes were discussed with the patient.  The patient concurred with the proposed plan, giving informed consent.  The site of surgery properly noted/marked. The patient was taken to Operating Room # C, identified as Amy Morgan and the procedure verified as C-Section Delivery. A Time Out was held and the above information confirmed.  After induction of anesthesia, the patient was draped and prepped in the usual sterile manner. A Pfannenstiel incision was made and carried down through the subcutaneous tissue to the fascia. Fascial incision was made and extended transversely. The fascia was separated from the underlying rectus tissue superiorly and inferiorly. The peritoneum was identified and entered. Peritoneal incision was extended longitudinally. The utero-vesical peritoneal reflection was incised transversely and the bladder flap was bluntly freed from the lower uterine segment. A low transverse uterine incision was made. Delivered from cephalic presentation was a  Female with Apgar scores of 9 at one minute and 9 at five minutes. After the umbilical cord was clamped and cut cord blood was obtained for evaluation. The placenta was removed intact and appeared normal. The uterine outline, tubes and ovaries appeared normal. The uterine incision was closed with running locked sutures of 0 monocryl. A second layer of 0 monocryl was used to imbricate the incision .Marland Kitchen Hemostasis was observed. Lavage was carried out until clear. Interseed was placed along the uterine  incision. The peritneum was reapproximated with 2-0 vicyrl.  The fascia was then reapproximated with running sutures of 0 pds.  The subcutaneous layer was reapproximated with 2-0 vicryl. The skin was reapproximated with 4-0 vicryl .  Instrument, sponge, and needle counts were correct prior the abdominal closure and at the conclusion of the case.   Findings: Female infant in the cephalic presentation thin meconium noted... normal appearing fallopian tubes and ovaries bilaterally   Estimated Blood Loss:  630 mL         Drains: None         Total IV Fluids:  Per anesthesia ml         Specimens: Placenta to pathology           Implants: None         Complications:  None; patient tolerated the procedure well.         Disposition: PACU - hemodynamically stable.         Condition: stable  Attending Attestation: I performed the procedure.

## 2020-06-04 NOTE — Anesthesia Postprocedure Evaluation (Signed)
Anesthesia Post Note  Patient: Amy Morgan  Procedure(s) Performed: CESAREAN SECTION (N/A )     Patient location during evaluation: PACU Anesthesia Type: Spinal Level of consciousness: oriented and awake and alert Pain management: pain level controlled Vital Signs Assessment: post-procedure vital signs reviewed and stable Respiratory status: spontaneous breathing, respiratory function stable and nonlabored ventilation Cardiovascular status: blood pressure returned to baseline and stable Postop Assessment: no headache, no backache, no apparent nausea or vomiting, spinal receding and patient able to bend at knees Anesthetic complications: no   No complications documented.  Last Vitals:  Vitals:   06/04/20 1128 06/04/20 1322  BP:  113/69  Pulse: 73 71  Resp: 19 20  Temp:  36.6 C  SpO2: 100% 98%    Last Pain:  Vitals:   06/04/20 1322  TempSrc: Oral   Pain Goal:                   Victorian Gunn A.

## 2020-06-04 NOTE — Progress Notes (Signed)
Patient ID: Amy Morgan, female   DOB: [birth year], 30 y.o.   MRN: [id number]  In to assess patient due to report of recurrent late decelerations on 1 mu of pitocin.  FHR tracing baseline 145 minimal variability and recurrent lat decelerations.  I recommend primary cesarean section  Due to nonreassuring fetal heart rate. Risk benefits and alternatives of cesarean section were discussed with the patient including but not limited to infection, bleeding, damage to bowel , bladder and baby with the need for further surgery. Risk of transfusion HIV/ Hep B&C. Pt voiced understanding and desires to proceed.

## 2020-06-05 LAB — CBC
HCT: 27.3 % — ABNORMAL LOW (ref 36.0–46.0)
Hemoglobin: 9.1 g/dL — ABNORMAL LOW (ref 12.0–15.0)
MCH: 29.7 pg (ref 26.0–34.0)
MCHC: 33.3 g/dL (ref 30.0–36.0)
MCV: 89.2 fL (ref 80.0–100.0)
Platelets: 206 10*3/uL (ref 150–400)
RBC: 3.06 MIL/uL — ABNORMAL LOW (ref 3.87–5.11)
RDW: 13.1 % (ref 11.5–15.5)
WBC: 19 10*3/uL — ABNORMAL HIGH (ref 4.0–10.5)
nRBC: 0 % (ref 0.0–0.2)

## 2020-06-05 MED ORDER — PANTOPRAZOLE SODIUM 40 MG PO TBEC
40.0000 mg | DELAYED_RELEASE_TABLET | Freq: Every day | ORAL | Status: DC
  Administered 2020-06-05 – 2020-06-07 (×3): 40 mg via ORAL
  Filled 2020-06-05 (×3): qty 1

## 2020-06-05 NOTE — Progress Notes (Signed)
Subjective: Postpartum Day 1: Cesarean Delivery Patient reports tolerating PO, + flatus and no problems voiding.    Objective: Vital signs in last 24 hours: Temp:  [97.8 F (36.6 C)-98.6 F (37 C)] 98.6 F (37 C) (04/23 1414) Pulse Rate:  [68-92] 92 (04/23 1414) Resp:  [16-18] 16 (04/23 1414) BP: (117-129)/(74-82) 129/74 (04/23 1414) SpO2:  [99 %-100 %] 99 % (04/23 1414)  Physical Exam:  General: alert, cooperative and no distress Lochia: appropriate Uterine Fundus: firm Incision: bandage clean dry and intact  DVT Evaluation: No evidence of DVT seen on physical exam.  Recent Labs    06/04/20 0043 06/05/20 0416  HGB 10.5* 9.1*  HCT 31.0* 27.3*    Assessment/Plan: Status post Cesarean section. Doing well postoperatively.  Continue current care Encouraged ambulation Pt desires circumcision of newborn prior to discharge. GERD- protonix   Gerald Leitz 06/05/2020, 4:35 PM

## 2020-06-05 NOTE — Plan of Care (Signed)
  Problem: Education: Goal: Knowledge of condition will improve Outcome: Progressing   Problem: Life Cycle: Goal: Chance of risk for complications during the postpartum period will decrease Outcome: Progressing   Problem: Skin Integrity: Goal: Demonstration of wound healing without infection will improve Outcome: Progressing

## 2020-06-05 NOTE — Lactation Note (Signed)
This note was copied from a baby's chart. Lactation Consultation Note  Patient Name: Amy Morgan Date: 06/05/2020 Reason for consult: Follow-up assessment;Early term 37-38.6wks;Other (Comment) (IUGR, SGA) Age:28 hours  Visited with mom of 30 hours old ETI female, baby hasn't been latching consistently at the breast and mom hasn't been pumping either; she said she tried yesterday but nothing came out. Explained to mom that the purpose of pumping this early on is for breast stimulation, not to get volume, she voiced understanding.  Dad was feeding baby with the bottle vertical and the baby horizontal, showed family the pace feeding technique; baby took 5 ml during this feeding. Parents understand that baby also need to sleep and rest to promote growth and they will wake him up only every 3 hours for feedings, unless he's cueing first.  Reviewed LPI behavior, feeding cues, size of baby's stomach and supplementation guidelines for LPIs. Mom understands that she'll still need to pump for a few more weeks after discharge in order to protect her supply.  Feeding plan:  1. Encouraged mom to feed baby STS 8-12 times/24 hours or sooner if feeding cue are present 2. She'll start pumping consistently, ideally every 3 hours to protect her supply 3. Parents will contineu supplementing with EBM/formula per    FOB and GOB (maternal) present in the room at the time of [facility] consultation. Family  reported all questions and concerns were answered, they're both aware of LC OP services and will call PRN.  Maternal Data    Feeding Mother's Current Feeding Choice: Breast Milk and Donor Milk Nipple Type: Nfant Slow Flow (purple)  LATCH Score                    Lactation Tools Discussed/Used Tools: Pump;Coconut oil Breast pump type: Double-Electric Breast Pump;Manual Pump Education: Setup, frequency, and cleaning;Milk Storage Reason for Pumping: ETI < 6 lbs Pumping frequency: q 3  hours Pumped volume:  (drops)  Interventions Interventions: Breast feeding basics reviewed;DEBP;Coconut oil;Hand pump  Discharge Pump: Personal;Manual;DEBP  Consult Status Consult Status: Follow-up Date: 06/06/20 Follow-up type: In-patient    Amy Morgan 06/05/2020, 3:43 PM

## 2020-06-06 DIAGNOSIS — Z98891 History of uterine scar from previous surgery: Secondary | ICD-10-CM

## 2020-06-06 MED ORDER — ENOXAPARIN SODIUM 80 MG/0.8ML ~~LOC~~ SOLN: 70.0000 mg | SUBCUTANEOUS | 1 refills | Status: DC

## 2020-06-06 MED ORDER — OXYCODONE HCL 5 MG PO TABS: 5.0000 mg | ORAL_TABLET | ORAL | 0 refills | Status: AC | PRN

## 2020-06-06 MED ORDER — ACETAMINOPHEN 500 MG PO TABS: 1000.0000 mg | ORAL_TABLET | Freq: Three times a day (TID) | ORAL | 0 refills | Status: AC | PRN

## 2020-06-06 MED ORDER — "INSULIN SYRINGE-NEEDLE U-100 30G X 5/16"" 1 ML MISC": 1 refills | Status: AC

## 2020-06-06 MED ORDER — FERROUS SULFATE 325 (65 FE) MG PO TABS: 325.0000 mg | ORAL_TABLET | Freq: Every day | ORAL | 1 refills | Status: AC

## 2020-06-06 MED ORDER — IBUPROFEN 600 MG PO TABS: 600.0000 mg | ORAL_TABLET | Freq: Four times a day (QID) | ORAL | 1 refills | Status: AC | PRN

## 2020-06-06 NOTE — Discharge Summary (Signed)
Postpartum Discharge Summary  Date of Service updated 06/06/2020     Patient Name: Amy Morgan DOB: 1991/01/27 MRN: [id number]  Date of admission: 06/04/2020 Delivery date:06/04/2020  Delivering provider: Christophe Louis  Date of discharge: 06/06/2020  Admitting diagnosis: IUGR (intrauterine growth restriction) affecting care of mother, third trimester, fetus 1 [O36.5931] Intrauterine pregnancy: [redacted]w[redacted]d    Secondary diagnosis:  Active Problems:   IUGR (intrauterine growth restriction) affecting care of mother, third trimester, fetus 1   S/P cesarean section  Additional problems: None    Discharge diagnosis: Term Pregnancy Delivered                                              Post partum procedures:None Augmentation: Pitocin and Cytotec Complications: None  Hospital course: Induction of Labor With Cesarean Section   30y.o. yo G2P1011 at 346w4das admitted to the hospital 06/04/2020 for induction of labor. Patient had a labor course significant for pt received cytotec x 1 and devloped late decelerations which resolved with positioning and fluid bolus. She was then started on pitocin and developed late decelerations on only 1 mu of pitocin . The patient went for cesarean section due to Non-Reassuring FHR. Delivery details are as follows: Membrane Rupture Time/Date: 9:34 AM ,06/04/2020   Delivery Method:C-Section, Low Transverse  Details of operation can be found in separate operative Note.  Patient had an uncomplicated postpartum course. She is ambulating, tolerating a regular diet, passing flatus, and urinating well.  Patient is discharged home in stable condition on 06/06/20.      Newborn Data: Birth date:06/04/2020  Birth time:9:35 AM  Gender:Female  Living status:Living  Apgars:9 ,9  Weight:2435 g                                 Magnesium Sulfate received: No BMZ received: No Rhophylac:N/A MMR:N/A T-DaP:Given prenatally Flu: N/A Transfusion:No  Physical exam  Vitals:    06/05/20 0409 06/05/20 1414 06/05/20 2020 06/06/20 0508  BP: 128/82 129/74 131/76 134/85  Pulse: 79 92 95 85  Resp: _0 Temp: 98.5 F (36.9 C) 98.6 F (37 C) 98.6 F (37 C) 98.8 F (37.1 C)  TempSrc: Oral Oral Oral Oral  SpO2: 100% 99%    Weight:      Height:       General: alert, cooperative and no distress Lochia: appropriate Uterine Fundus: firm Incision: Healing well with no significant drainage DVT Evaluation: No evidence of DVT seen on physical exam. Labs: Lab Results  Component Value Date   WBC 19.0 (H) 06/05/2020   HGB 9.1 (L) 06/05/2020   HCT 27.3 (L) 06/05/2020   MCV 89.2 06/05/2020   PLT 206 06/05/2020   CMP Latest Ref Rng & Units 06/04/2020  Glucose 70 - 99 mg/dL 124(H)  BUN 6 - 20 mg/dL 7  Creatinine 0.44 - 1.00 mg/dL 0.66  Sodium 135 - 145 mmol/L 136  Potassium 3.5 - 5.1 mmol/L 3.6  Chloride 98 - 111 mmol/L 104  CO2 22 - 32 mmol/L 23  Calcium 8.9 - 10.3 mg/dL 9.4  Total Protein 6.5 - 8.1 g/dL 6.0(L)  Total Bilirubin 0.3 - 1.2 mg/dL 0.3  Alkaline Phos 38 - 126 U/L 58  AST 15 - 41 U/L 17  ALT 0 - 44  U/L 15   Edinburgh Score: Edinburgh Postnatal Depression Scale Screening Tool 06/05/2020  I have been able to laugh and see the funny side of things. 0  I have looked forward with enjoyment to things. 0  I have blamed myself unnecessarily when things went wrong. 2  I have been anxious or worried for no good reason. 0  I have felt scared or panicky for no good reason. 0  Things have been getting on top of me. 1  I have been so unhappy that I have had difficulty sleeping. 0  I have felt sad or miserable. 0  I have been so unhappy that I have been crying. 0  The thought of harming myself has occurred to me. 0  Edinburgh Postnatal Depression Scale Total 3      After visit meds:  Allergies as of 06/06/2020   No Known Allergies     Medication List    STOP taking these medications   heparin 5000 UNIT/ML injection     TAKE these medications    acetaminophen 500 MG tablet Commonly known as: TYLENOL Take 2 tablets (1,000 mg total) by mouth every 8 (eight) hours as needed.   cetirizine 10 MG tablet Commonly known as: ZYRTEC Take 10 mg by mouth daily.   enoxaparin 80 MG/0.8ML injection Commonly known as: LOVENOX Inject 0.7 mLs (70 mg total) into the skin daily. Start taking on: June 07, 2020 What changed:   medication strength  how much to take   ferrous sulfate 325 (65 FE) MG tablet Take 1 tablet (325 mg total) by mouth daily with breakfast.   ibuprofen 600 MG tablet Commonly known as: ADVIL Take 1 tablet (600 mg total) by mouth every 6 (six) hours as needed.   Insulin Syringe-Needle U-100 30G X 5/16" 1 ML Misc Commonly known as: UltiCare Insulin Syringe 70 mg of lovenox [place] once daily What changed:   how to take this  when to take this  additional instructions   oxyCODONE 5 MG immediate release tablet Commonly known as: Oxy IR/ROXICODONE Take 1-2 tablets (5-10 mg total) by mouth every 4 (four) hours as needed for up to 7 days for moderate pain or severe pain.   PRENATAL VITAMINS PO Take by mouth.        Discharge home in stable condition Infant Feeding: Breast Infant Disposition:home with mother Discharge instruction: per After Visit Summary and Postpartum booklet. Activity: Advance as tolerated. Pelvic rest for 6 weeks.  Diet: routine diet Anticipated Birth Control: Unsure Postpartum Appointment:2 weeks Additional Postpartum F/U: Incision check 2 weeks  Future Appointments:No future appointments. Follow up Visit:  Follow-up Information    Christophe Louis, MD. Schedule an appointment as soon as possible for a visit in 2 week(s).   Specialty: Obstetrics and Gynecology Why: please make an appointment for an incision check in 2 weeks  Contact information: [address] [place] [address] [phone]                   06/06/2020 Christophe Louis, MD

## 2020-06-07 LAB — SURGICAL PATHOLOGY

## 2020-06-07 NOTE — Lactation Note (Signed)
This note was copied from a baby's chart. Lactation Consultation Note  Patient Name: Amy Morgan Date: 06/07/2020 Reason for consult: Follow-up assessment;Early term 37-38.6wks;Primapara;1st time breastfeeding;Hyperbilirubinemia;Infant < 6lbs Age:28 hours   P1 mother whose infant is now 37 hours old.  This is an ETI at 28+4 weeks weighing < 6 lbs at birth.  Mother was discharged yesterday and baby remained as a baby patient to continue to observe feedings and jaundice.  The last bilirubin level was 13.8 mg/dl at 28 hours of life (high intermediate risk zone).  Mother informed me that her son, Amy Morgan, is feeding much better now.  She continues to supplement with adequate volumes of donor breast milk.  Last LATCH score is an 8 and baby is voiding/stooling well.  The stools are transitioning.  Mother has been pumping with the DEBP and has EBM in the refrigerator.  Asked her to use her EBM with the next feeding before providing donor breast milk.  Mother verbalized understanding.  Feeding plan established for after discharge.  Mother made her first pediatric appointment for tomorrow morning.  She has a DEBP for home use.  Support person present and asleep.  Mother has our OP phone number for any further questions/concerns.     Maternal Data    Feeding Mother's Current Feeding Choice: Breast Milk and Donor Milk  LATCH Score                    Lactation Tools Discussed/Used Breast pump type: Double-Electric Breast Pump;Manual Reason for Pumping: Breast stimulation and supplementation for ETI  Interventions    Discharge Discharge Education: Engorgement and breast care Pump: DEBP;Manual;Personal  Consult Status Consult Status: Complete Date: 06/07/20 Follow-up type: Call as needed    Amy Morgan 06/07/2020, 8:31 AM

## 2020-06-07 NOTE — Lactation Note (Signed)
This note was copied from a baby's chart. Lactation Consultation Note Asked mom how BF going. Mom stated much better since she got the NS.  Mom telling LC she is pumping and getting a little colostrum and she is drinking Mother's milk to get her milk to come on in. Mom is giving DBM as well. Asked mom to call for [facility] for next feeding for LC to see feeding and assist as needed. Mom stated OK.  Patient Name: Amy Morgan [id number] Date: 06/07/2020   Age:32 hours  Maternal Data    Feeding Nipple Type: Extra Slow Flow  LATCH Score                    Lactation Tools Discussed/Used    Interventions    Discharge    Consult Status      [name] 06/07/2020, 12:30 AM

## 2020-12-16 ENCOUNTER — Emergency Department (INDEPENDENT_AMBULATORY_CARE_PROVIDER_SITE_OTHER)
Admission: RE | Admit: 2020-12-16 | Discharge: 2020-12-16 | Disposition: A | Discharge status: Home / Self Care | Payer: 59 | Source: Ambulatory Visit | Attending: Family Medicine | Admitting: Family Medicine

## 2020-12-16 ENCOUNTER — Other Ambulatory Visit: Payer: Self-pay

## 2020-12-16 VITALS — BP 148/91 | HR 82 | Temp 98.3°F | Resp 18

## 2020-12-16 DIAGNOSIS — J101 Influenza due to other identified influenza virus with other respiratory manifestations: Secondary | ICD-10-CM | POA: Diagnosis not present

## 2020-12-16 LAB — POC INFLUENZA A AND B ANTIGEN (URGENT CARE ONLY)
Influenza A Ag: POSITIVE — AB
Influenza B Ag: NEGATIVE

## 2020-12-16 MED ORDER — OSELTAMIVIR PHOSPHATE 75 MG PO CAPS
75.0000 mg | ORAL_CAPSULE | Freq: Two times a day (BID) | ORAL | 0 refills | Status: DC
Start: 2020-12-16 — End: 2022-02-23

## 2020-12-16 NOTE — Discharge Instructions (Addendum)
Take Tamiflu 2 times a day for 5 days Drink lots of fluids Tylenol or ibuprofen for pain and fever Off of work until Monday

## 2020-12-16 NOTE — ED Provider Notes (Signed)
[facility]    CSN: [id number] Arrival date & time: 12/16/20  1205      History   Chief Complaint Chief Complaint  Patient presents with   Fever   Nasal Congestion   Generalized Body Aches    HPI Amy Morgan is a 30 y.o. female.   HPI Patient has runny nose, chills and body aches, fever to 101.  Started Monday night.  She is not quite 72 hours from onset of illness.  She and her family went to a football game over the weekend.  Otherwise unknown exposure to influenza or illness.  Past Medical History:  Diagnosis Date   DVT (deep venous thrombosis) (HCC)    Obesity     Patient Active Problem List   Diagnosis Date Noted   S/P cesarean section 06/06/2020   IUGR (intrauterine growth restriction) affecting care of mother, third trimester, fetus 1 06/04/2020    Past Surgical History:  Procedure Laterality Date   ANTERIOR CRUCIATE LIGAMENT REPAIR     CESAREAN SECTION N/A 06/04/2020   Procedure: CESAREAN SECTION;  Surgeon: Gerald Leitz, MD;  Location: [facility] LD ORS;  Service: Obstetrics;  Laterality: N/A;    OB History     Gravida  2   Para  1   Term  1   Preterm      AB  1   Living  1      SAB      IAB  1   Ectopic      Multiple  0   Live Births  1            Home Medications    Prior to Admission medications   Medication Sig Start Date End Date Taking? Authorizing Provider  oseltamivir (TAMIFLU) 75 MG capsule Take 1 capsule (75 mg total) by mouth every 12 (twelve) hours. 12/16/20  Yes Eustace Moore, MD  acetaminophen (TYLENOL) 500 MG tablet Take 2 tablets (1,000 mg total) by mouth every 8 (eight) hours as needed. 06/06/20   Gerald Leitz, MD  cetirizine (ZYRTEC) 10 MG tablet Take 10 mg by mouth daily.    [provider]  enoxaparin (LOVENOX) 80 MG/0.8ML injection Inject 0.7 mLs (70 mg total) into the skin daily. 06/07/20 08/06/20  Gerald Leitz, MD  ferrous sulfate 325 (65 FE) MG tablet Take 1 tablet (325 mg total) by mouth  daily with breakfast. 06/06/20   Gerald Leitz, MD  ibuprofen (ADVIL) 600 MG tablet Take 1 tablet (600 mg total) by mouth every 6 (six) hours as needed. 06/06/20   Gerald Leitz, MD  Insulin Syringe-Needle U-100 [place] INSULIN SYRINGE) 30G X 5/16" 1 ML MISC 70 mg of lovenox [place] once daily 06/06/20   Gerald Leitz, MD  Prenatal Vit-Fe Fumarate-FA (PRENATAL VITAMINS PO) Take by mouth.    [provider]    Family History Family History  Problem Relation Age of Onset   Hypertension Maternal Grandmother    Diabetes Paternal Grandmother     Social History Social History   Tobacco Use   Smoking status: Never   Smokeless tobacco: Never  Vaping Use   Vaping Use: Never used  Substance Use Topics   Alcohol use: Not Currently    Comment: Soc prior to preg   Drug use: Never     Allergies   Patient has no known allergies.   Review of Systems Review of Systems  See HPI Physical Exam Triage Vital Signs ED Triage Vitals  Enc Vitals Group  BP 12/16/20 1229 (!) 148/91     Pulse Rate 12/16/20 1229 82     Resp 12/16/20 1229 18     Temp 12/16/20 1229 98.3 F (36.8 C)     Temp Source 12/16/20 1229 Oral     SpO2 12/16/20 1229 99 %     Weight --      Height --      Head Circumference --      Peak Flow --      Pain Score 12/16/20 1230 0     Pain Loc --      Pain Edu? --      Excl. in GC? --    No data found.  Updated Vital Signs BP (!) 148/91 (BP Location: Right Arm)   Pulse 82   Temp 98.3 F (36.8 C) (Oral)   Resp 18   SpO2 99%   Breastfeeding No     Physical Exam Constitutional:      General: She is not in acute distress.    Appearance: She is well-developed. She is ill-appearing.  HENT:     Head: Normocephalic and atraumatic.     Right Ear: Tympanic membrane and ear canal normal.     Left Ear: Tympanic membrane and ear canal normal.     Nose: Congestion and rhinorrhea present.     Mouth/Throat:     Pharynx: No posterior oropharyngeal erythema.  Eyes:      Conjunctiva/sclera: Conjunctivae normal.     Pupils: Pupils are equal, round, and reactive to light.  Cardiovascular:     Rate and Rhythm: Normal rate and regular rhythm.     Heart sounds: Normal heart sounds.  Pulmonary:     Effort: Pulmonary effort is normal. No respiratory distress.     Breath sounds: Normal breath sounds.  Abdominal:     General: There is no distension.     Palpations: Abdomen is soft.  Musculoskeletal:        General: Normal range of motion.     Cervical back: Normal range of motion.  Lymphadenopathy:     Cervical: Cervical adenopathy present.  Skin:    General: Skin is warm and dry.  Neurological:     Mental Status: She is alert.  Psychiatric:        Mood and Affect: Mood normal.        Behavior: Behavior normal.     UC Treatments / Results  Labs (all labs ordered are listed, but only abnormal results are displayed) Labs Reviewed  POC INFLUENZA A AND B ANTIGEN (URGENT CARE ONLY) - Abnormal; Notable for the following components:      Result Value   Influenza A Ag Positive (*)    All other components within normal limits    EKG   Radiology No results found.  Procedures Procedures (including critical care time)  Medications Ordered in UC Medications - No data to display  Initial Impression / Assessment and Plan / UC Course  I have reviewed the triage vital signs and the nursing notes.  Pertinent labs & imaging results that were available during my care of the patient were reviewed by me and considered in my medical decision making (see chart for details).     Reviewed treatment of flu.  Contagiousness.  Off work until Monday Final Clinical Impressions(s) / UC Diagnoses   Final diagnoses:  Influenza A     Discharge Instructions      Take Tamiflu 2 times a day for 5 days  Drink lots of fluids Tylenol or ibuprofen for pain and fever Off of work until Monday   ED Prescriptions     Medication Sig Dispense Auth. Provider    oseltamivir (TAMIFLU) 75 MG capsule Take 1 capsule (75 mg total) by mouth every 12 (twelve) hours. 10 capsule Eustace Moore, MD      PDMP not reviewed this encounter.   Eustace Moore, MD 12/16/20 [phone]

## 2020-12-16 NOTE — ED Triage Notes (Signed)
Pt c/o chills, runny nose and bodyaches. Fever 101 on Monday. Cough started Tues. Breast felt engorged. Stopped breastfeeding in Aug. Wants to r/o flu or mastitis. Dayquil, benedryl and motrin prn. At home covid neg on Tues am.

## 2022-02-23 ENCOUNTER — Ambulatory Visit (INDEPENDENT_AMBULATORY_CARE_PROVIDER_SITE_OTHER): Payer: 59 | Admitting: Sports Medicine

## 2022-02-23 ENCOUNTER — Ambulatory Visit (INDEPENDENT_AMBULATORY_CARE_PROVIDER_SITE_OTHER): Payer: 59

## 2022-02-23 VITALS — BP 120/84 | HR 99 | Ht 68.0 in | Wt 273.0 lb

## 2022-02-23 DIAGNOSIS — M25551 Pain in right hip: Secondary | ICD-10-CM | POA: Diagnosis not present

## 2022-02-23 DIAGNOSIS — S76011A Strain of muscle, fascia and tendon of right hip, initial encounter: Secondary | ICD-10-CM

## 2022-02-23 MED ORDER — CYCLOBENZAPRINE HCL 5 MG PO TABS: 5.0000 mg | ORAL_TABLET | Freq: Every day | ORAL | 0 refills | Status: AC

## 2022-02-23 MED ORDER — MELOXICAM 15 MG PO TABS: 15.0000 mg | ORAL_TABLET | Freq: Every day | ORAL | 0 refills | Status: AC

## 2022-02-23 NOTE — Patient Instructions (Addendum)
Good to see you  Hip HEP - Start meloxicam 15 mg daily x2 weeks.  If still having pain after 2 weeks, complete 3rd-week of meloxicam. May use remaining meloxicam as needed once daily for pain control.  Do not to use additional NSAIDs while taking meloxicam.  May use Tylenol [phone] mg 2 to 3 times a day for breakthrough pain. Recommend following up with OBGYN  3-4 week follow up

## 2022-02-23 NOTE — Progress Notes (Signed)
Amy Morgan D.[name] Phone: [phone]   Assessment and Plan:     1. Right hip pain 2. Muscle strain of right gluteal region, initial encounter -Chronic with exacerbation, initial sports medicine visit - Most consistent with recurrent pain of deep gluteal musculature on right side based on HPI, physical exam, x-ray imaging - Start meloxicam 15 mg daily x2 weeks.  If still having pain after 2 weeks, complete 3rd-week of meloxicam. May use remaining meloxicam as needed once daily for pain control.  Do not to use additional NSAIDs while taking meloxicam.  May use Tylenol [phone] mg 2 to 3 times a day for breakthrough pain. - Start Flexeril 5 to 10 mg nightly as needed for muscle spasms - Start HEP for gluteal musculature - X-ray obtained in clinic.  My interpretation: No acute fracture or dislocation.  Relatively unremarkable imaging of bilateral hips.  Concern for malpositioned IUD based on IUD angulation on x-ray  Based on IUD angulation on x-ray, I do have a concern for malpositioned IUD, though patient may have an angled cervix to explain angulation of IUD.  Recommend follow-up with OB/GYN further evaluation to ensure proper placement  Other orders - meloxicam (MOBIC) 15 MG tablet; Take 1 tablet (15 mg total) by mouth daily. - cyclobenzaprine (FLEXERIL) 5 MG tablet; Take 1 tablet (5 mg total) by mouth at bedtime.    Pertinent previous records reviewed include none   Follow Up: 3 to 4 weeks for reevaluation.  If no improvement or worsening of symptoms, could consider OMT versus physical therapy versus CSI.  Ultimately patient may require MRI of hip if no improvement or worsening of symptoms   Subjective:   I, Amy Morgan, am serving as a Education administrator for Amy Morgan  Chief Complaint: hip pain   HPI:   02/23/22 Patient is a 32 year old female complaining of hip pain . Patient states that  she has always struggled with hip discomfort especially after pregnancy , she has lot 50 lb since giving birth, she flare ups and will have throbbing pain , can't sleep at night, has tried strengthening and stretching isnt able to get relief affecting ADLS, she can pop her hip and will gt some comfort,ib isnt helping, numbness does happen after sitting for a period of time ,hx of ACL repairs, was going to chiro in the past and that helped some    Relevant Historical Information: History of PE  Additional pertinent review of systems negative.   Current Outpatient Medications:    cyclobenzaprine (FLEXERIL) 5 MG tablet, Take 1 tablet (5 mg total) by mouth at bedtime., Disp: 30 tablet, Rfl: 0   meloxicam (MOBIC) 15 MG tablet, Take 1 tablet (15 mg total) by mouth daily., Disp: 30 tablet, Rfl: 0   acetaminophen (TYLENOL) 500 MG tablet, Take 2 tablets (1,000 mg total) by mouth every 8 (eight) hours as needed., Disp: 30 tablet, Rfl: 0   cetirizine (ZYRTEC) 10 MG tablet, Take 10 mg by mouth daily., Disp: , Rfl:    ferrous sulfate 325 (65 FE) MG tablet, Take 1 tablet (325 mg total) by mouth daily with breakfast., Disp: 30 tablet, Rfl: 1   ibuprofen (ADVIL) 600 MG tablet, Take 1 tablet (600 mg total) by mouth every 6 (six) hours as needed., Disp: 30 tablet, Rfl: 1   Insulin Syringe-Needle U-100 (ULTICARE INSULIN SYRINGE) 30G X 5/16" 1 ML MISC, 70 mg of lovenox [place] once daily,  Disp: 30 each, Rfl: 1   Prenatal Vit-Fe Fumarate-FA (PRENATAL VITAMINS PO), Take by mouth., Disp: , Rfl:    Objective:     Vitals:   02/23/22 1028  BP: 120/84  Pulse: 99  SpO2: 99%  Weight: 273 lb (123.8 kg)  Height: 5\' 8"  (1.727 m)      Body mass index is 41.51 kg/m.    Physical Exam:    General: awake, alert, and oriented no acute distress, nontoxic Skin: no suspicious lesions or rashes Neuro:sensation intact distally with no dificits, normal muscle tone, no atrophy, strength 5/5 in all tested lower ext groups Psych:  normal mood and affect, speech clear  Right hip: No deformity, swelling or wasting ROM Flexion 90, ext 30, IR 35, ER 45 TTP right lumbar paraspinal, right gluteal musculature NTTP over the hip flexors, greater trochanter,   si joint,  Negative log roll with FROM Negative FABER Positive FADIR for lateral hip pain Positive piriformis test for pain, though no radicular symptoms Positive right trendelenberg Gait normal    Electronically signed by:  Amy Morgan D.[facility] 11:39 AM 02/23/22

## 2022-04-05 NOTE — Progress Notes (Deleted)
    [name] D.[place] Phone: [phone]   Assessment and Plan:     There are no diagnoses linked to this encounter.  ***   Pertinent previous records reviewed include ***   Follow Up: ***     Subjective:   I, Brelynn Wheller, am serving as a Education administrator for Doctor Glennon Mac   Chief Complaint: hip pain    HPI:    02/23/22 Patient is a 32 year old female complaining of hip pain . Patient states that she has always struggled with hip discomfort especially after pregnancy , she has lot 50 lb since giving birth, she flare ups and will have throbbing pain , can't sleep at night, has tried strengthening and stretching isnt able to get relief affecting ADLS, she can pop her hip and will gt some comfort,ib isnt helping, numbness does happen after sitting for a period of time ,hx of ACL repairs, was going to chiro in the past and that helped some    04/06/2022 Patient states    Relevant Historical Information: History of PE  Additional pertinent review of systems negative.   Current Outpatient Medications:    acetaminophen (TYLENOL) 500 MG tablet, Take 2 tablets (1,000 mg total) by mouth every 8 (eight) hours as needed., Disp: 30 tablet, Rfl: 0   cetirizine (ZYRTEC) 10 MG tablet, Take 10 mg by mouth daily., Disp: , Rfl:    cyclobenzaprine (FLEXERIL) 5 MG tablet, Take 1 tablet (5 mg total) by mouth at bedtime., Disp: 30 tablet, Rfl: 0   ferrous sulfate 325 (65 FE) MG tablet, Take 1 tablet (325 mg total) by mouth daily with breakfast., Disp: 30 tablet, Rfl: 1   ibuprofen (ADVIL) 600 MG tablet, Take 1 tablet (600 mg total) by mouth every 6 (six) hours as needed., Disp: 30 tablet, Rfl: 1   Insulin Syringe-Needle U-100 (ULTICARE INSULIN SYRINGE) 30G X 5/16" 1 ML MISC, 70 mg of lovenox [place] once daily, Disp: 30 each, Rfl: 1   meloxicam (MOBIC) 15 MG tablet, Take 1 tablet (15 mg total) by mouth daily., Disp: 30  tablet, Rfl: 0   Prenatal Vit-Fe Fumarate-FA (PRENATAL VITAMINS PO), Take by mouth., Disp: , Rfl:    Objective:     There were no vitals filed for this visit.    There is no height or weight on file to calculate BMI.    Physical Exam:    ***   Electronically signed by:  [name] 4:10 PM 04/05/22

## 2022-04-06 ENCOUNTER — Ambulatory Visit: Payer: 59 | Admitting: Sports Medicine

## 2022-05-27 ENCOUNTER — Encounter: Payer: Self-pay | Admitting: *Deleted

## 2022-05-27 LAB — AMB RESULTS CONSOLE CBG: Glucose: 130

## 2022-06-07 ENCOUNTER — Encounter: Payer: Self-pay | Admitting: *Deleted

## 2022-06-07 NOTE — Progress Notes (Signed)
Pt attended 05/27/22 screening event where her b/p was 125/93. At the event, the patient did not identify and SDOH insecurities and identified her PCP. During follow-up call, pt noted her b/p was usually "really low" and stated she did indeed have a PCP, Amy Lennert PA at [facility], with whom she could f/u her b/p and other health care needs. No additional health equity team support indicated at this time.
